# Patient Record
Sex: Male | Born: 1955 | Race: White | Hispanic: No | Marital: Married | Smoking: Never smoker
Health system: Southern US, Community
[De-identification: ages and names within clinical notes are randomized; demographics above are authoritative.]

## PROBLEM LIST (undated history)

## (undated) DIAGNOSIS — I10 Essential (primary) hypertension: Secondary | ICD-10-CM

## (undated) DIAGNOSIS — E7800 Pure hypercholesterolemia, unspecified: Secondary | ICD-10-CM

## (undated) DIAGNOSIS — J189 Pneumonia, unspecified organism: Secondary | ICD-10-CM

---

## 2019-12-22 ENCOUNTER — Encounter (HOSPITAL_COMMUNITY): Payer: Self-pay | Admitting: Emergency Medicine

## 2019-12-22 ENCOUNTER — Emergency Department (HOSPITAL_COMMUNITY): Payer: Medicare HMO

## 2019-12-22 ENCOUNTER — Emergency Department (HOSPITAL_COMMUNITY)
Admission: EM | Admit: 2019-12-22 | Discharge: 2019-12-22 | Disposition: A | Discharge status: Home / Self Care | Payer: Medicare HMO | Attending: Emergency Medicine | Admitting: Emergency Medicine

## 2019-12-22 ENCOUNTER — Other Ambulatory Visit: Payer: Self-pay

## 2019-12-22 DIAGNOSIS — J209 Acute bronchitis, unspecified: Secondary | ICD-10-CM | POA: Insufficient documentation

## 2019-12-22 DIAGNOSIS — I10 Essential (primary) hypertension: Secondary | ICD-10-CM | POA: Insufficient documentation

## 2019-12-22 DIAGNOSIS — Z20822 Contact with and (suspected) exposure to covid-19: Secondary | ICD-10-CM | POA: Insufficient documentation

## 2019-12-22 DIAGNOSIS — R634 Abnormal weight loss: Secondary | ICD-10-CM | POA: Insufficient documentation

## 2019-12-22 DIAGNOSIS — R918 Other nonspecific abnormal finding of lung field: Secondary | ICD-10-CM | POA: Diagnosis not present

## 2019-12-22 DIAGNOSIS — R059 Cough, unspecified: Secondary | ICD-10-CM

## 2019-12-22 DIAGNOSIS — R05 Cough: Secondary | ICD-10-CM | POA: Diagnosis not present

## 2019-12-22 DIAGNOSIS — R911 Solitary pulmonary nodule: Secondary | ICD-10-CM

## 2019-12-22 DIAGNOSIS — R0602 Shortness of breath: Secondary | ICD-10-CM | POA: Diagnosis present

## 2019-12-22 DIAGNOSIS — R630 Anorexia: Secondary | ICD-10-CM | POA: Diagnosis not present

## 2019-12-22 HISTORY — DX: Pneumonia, unspecified organism: J18.9

## 2019-12-22 HISTORY — DX: Pure hypercholesterolemia, unspecified: E78.00

## 2019-12-22 HISTORY — DX: Essential (primary) hypertension: I10

## 2019-12-22 LAB — CBC
HCT: 48.3 % (ref 39.0–52.0)
Hemoglobin: 16.8 g/dL (ref 13.0–17.0)
MCH: 31.1 pg (ref 26.0–34.0)
MCHC: 34.8 g/dL (ref 30.0–36.0)
MCV: 89.4 fL (ref 80.0–100.0)
Platelets: 214 10*3/uL (ref 150–400)
RBC: 5.4 MIL/uL (ref 4.22–5.81)
RDW: 12.4 % (ref 11.5–15.5)
WBC: 7.5 10*3/uL (ref 4.0–10.5)
nRBC: 0 % (ref 0.0–0.2)

## 2019-12-22 LAB — BASIC METABOLIC PANEL
Anion gap: 11 (ref 5–15)
BUN: 13 mg/dL (ref 8–23)
CO2: 27 mmol/L (ref 22–32)
Calcium: 9.7 mg/dL (ref 8.9–10.3)
Chloride: 98 mmol/L (ref 98–111)
Creatinine, Ser: 1.08 mg/dL (ref 0.61–1.24)
GFR calc Af Amer: >60 mL/min (ref >60)
GFR calc non Af Amer: >60 mL/min (ref >60)
Glucose, Bld: 109 mg/dL — ABNORMAL HIGH (ref 70–99)
Potassium: 4.4 mmol/L (ref 3.5–5.1)
Sodium: 136 mmol/L (ref 135–145)

## 2019-12-22 LAB — POCT I-STAT EG7
Acid-Base Excess: 3 mmol/L — ABNORMAL HIGH (ref 0.0–2.0)
Bicarbonate: 28.1 mmol/L — ABNORMAL HIGH (ref 20.0–28.0)
Calcium, Ion: 1.16 mmol/L (ref 1.15–1.40)
HCT: 46 % (ref 39.0–52.0)
Hemoglobin: 15.6 g/dL (ref 13.0–17.0)
O2 Saturation: 46 %
Potassium: 4.6 mmol/L (ref 3.5–5.1)
Sodium: 136 mmol/L (ref 135–145)
TCO2: 29 mmol/L (ref 22–32)
pCO2, Ven: 42.7 mmHg — ABNORMAL LOW (ref 44.0–60.0)
pH, Ven: 7.427 (ref 7.250–7.430)
pO2, Ven: 25 mmHg — CL (ref 32.0–45.0)

## 2019-12-22 LAB — POC SARS CORONAVIRUS 2 AG -  ED: SARS Coronavirus 2 Ag: NEGATIVE

## 2019-12-22 LAB — D-DIMER, QUANTITATIVE: D-Dimer, Quant: 0.31 ug/mL-FEU (ref 0.00–0.50)

## 2019-12-22 LAB — TROPONIN I (HIGH SENSITIVITY)
Troponin I (High Sensitivity): 5 ng/L (ref <18)
Troponin I (High Sensitivity): 6 ng/L (ref <18)

## 2019-12-22 LAB — SARS CORONAVIRUS 2 (TAT 6-24 HRS): SARS Coronavirus 2: NEGATIVE

## 2019-12-22 MED ORDER — HYDROCOD POLST-CPM POLST ER 10-8 MG/5ML PO SUER
5.0000 mL | Freq: Once | ORAL | Status: AC
  Administered 2019-12-22: 5 mL via ORAL
  Filled 2019-12-22: qty 5

## 2019-12-22 MED ORDER — IOHEXOL 350 MG/ML SOLN
100.0000 mL | Freq: Once | INTRAVENOUS | Status: AC | PRN
  Administered 2019-12-22: 58 mL via INTRAVENOUS

## 2019-12-22 MED ORDER — SODIUM CHLORIDE 0.9 % IV BOLUS
1000.0000 mL | Freq: Once | INTRAVENOUS | Status: AC
  Administered 2019-12-22: 1000 mL via INTRAVENOUS

## 2019-12-22 MED ORDER — SODIUM CHLORIDE 0.9% FLUSH
3.0000 mL | Freq: Once | INTRAVENOUS | Status: AC
  Administered 2019-12-22: 3 mL via INTRAVENOUS

## 2019-12-22 MED ORDER — ALBUTEROL SULFATE HFA 108 (90 BASE) MCG/ACT IN AERS
2.0000 | INHALATION_SPRAY | RESPIRATORY_TRACT | Status: DC | PRN
  Administered 2019-12-22: 2 via RESPIRATORY_TRACT
  Filled 2019-12-22: qty 6.7

## 2019-12-22 MED ORDER — BENZONATATE 100 MG PO CAPS: 100.0000 mg | ORAL_CAPSULE | Freq: Three times a day (TID) | ORAL | 0 refills | Status: AC

## 2019-12-22 MED ORDER — DEXAMETHASONE SODIUM PHOSPHATE 10 MG/ML IJ SOLN
10.0000 mg | Freq: Once | INTRAMUSCULAR | Status: AC
  Administered 2019-12-22: 10 mg via INTRAVENOUS
  Filled 2019-12-22: qty 1

## 2019-12-22 NOTE — ED Provider Notes (Signed)
[facility] EMERGENCY DEPARTMENT Provider Note   CSN: [id number] Arrival date & time: 12/22/19  1218     History Chief Complaint  Patient presents with  . Shortness of Breath    Hendryx Ricke is a 64 y.o. male.  HPI 64 year old male history of hypertension, hypercholesterolemia presents today complaining of cough and dyspnea.  Patient has had some URI symptoms for 3 to 4 weeks.  He has been seen in his primary care office February 2 thought to have sinusitis.  He was placed on Zithromax.  He returned on February 8 and had cough and increased dyspnea.  He was placed on Cefpodoxime and doxycycline.  He completed a week of these.  He has continued to feel dyspneic and have a productive cough.  Complaining of chest pain only with coughing.  He was sent for Covid test at one point in time, but told that it was too late for him to be tested and has not had any Covid testing.  He has had no definite Covid exposures.  He is not a smoker and has no history of lung disease.  He reports significant weight loss of 10 to 20 pounds.  He has not had nausea or vomiting but has had decreased appetite.    Past Medical History:  Diagnosis Date  . High cholesterol   . Hypertension   . Pneumonia     There are no problems to display for this patient.   History reviewed. No pertinent surgical history.     No family history on file.  Social History   Tobacco Use  . Smoking status: Never Smoker  . Smokeless tobacco: Never Used  Substance Use Topics  . Alcohol use: Not Currently  . Drug use: Never    Home Medications Prior to Admission medications   Not on File    Allergies    Patient has no allergy information on record.  Review of Systems   Review of Systems  All other systems reviewed and are negative.   Physical Exam Updated Vital Signs BP 137/79 (BP Location: Right Arm)   Pulse 85   Temp 98 F (36.7 C) (Oral)   Resp 16   Ht 1.905 m (6\' 3" )   Wt 85.7 kg    SpO2 93%   BMI 23.62 kg/m   Physical Exam Vitals reviewed.  Constitutional:      Appearance: He is well-developed. He is ill-appearing.  HENT:     Head: Normocephalic and atraumatic.     Mouth/Throat:     Mouth: Mucous membranes are moist.  Eyes:     Pupils: Pupils are equal, round, and reactive to light.  Cardiovascular:     Rate and Rhythm: Normal rate and regular rhythm.  Pulmonary:     Effort: Pulmonary effort is normal.     Breath sounds: Normal breath sounds.  Abdominal:     Palpations: Abdomen is soft.  Musculoskeletal:        General: Normal range of motion.     Cervical back: Normal range of motion and neck supple.     Right lower leg: No tenderness. No edema.     Left lower leg: No tenderness. No edema.  Skin:    General: Skin is warm and dry.     Capillary Refill: Capillary refill takes less than 2 seconds.  Neurological:     General: No focal deficit present.     Mental Status: He is alert.  Psychiatric:  Mood and Affect: Mood normal.     ED Results / Procedures / Treatments   Labs (all labs ordered are listed, but only abnormal results are displayed) Labs Reviewed  BASIC METABOLIC PANEL - Abnormal; Notable for the following components:      Result Value   Glucose, Bld 109 (*)    All other components within normal limits  POCT I-STAT EG7 - Abnormal; Notable for the following components:   pCO2, Ven 42.7 (*)    pO2, Ven 25.0 (*)    Bicarbonate 28.1 (*)    Acid-Base Excess 3.0 (*)    All other components within normal limits  SARS CORONAVIRUS 2 (TAT 6-24 HRS)  CBC  D-DIMER, QUANTITATIVE (NOT AT [facility])  BLOOD GAS, VENOUS  POC SARS CORONAVIRUS 2 AG -  ED  TROPONIN I (HIGH SENSITIVITY)    EKG None  Radiology DG Chest Port 1 View  Result Date: 12/22/2019 CLINICAL DATA:  64 year old male with history of cough and dyspnea for 1 month. EXAM: PORTABLE CHEST 1 VIEW COMPARISON:  No priors. FINDINGS: Lung volumes are normal. No consolidative  airspace disease. No pleural effusions. No pneumothorax. No pulmonary nodule or mass noted. Pulmonary vasculature and the cardiomediastinal silhouette are within normal limits. IMPRESSION: No radiographic evidence of acute cardiopulmonary disease. Electronically Signed   By: Trudie Reed M.D.   On: 12/22/2019 13:27    Procedures Procedures (including critical care time)  Medications Ordered in ED Medications  sodium chloride flush (NS) 0.9 % injection 3 mL (3 mLs Intravenous Given 12/22/19 1319)  sodium chloride 0.9 % bolus 1,000 mL (1,000 mLs Intravenous New Bag/Given 12/22/19 1318)    ED Course  I have reviewed the triage vital signs and the nursing notes.  Pertinent labs & imaging results that were available during my care of the patient were reviewed by me and considered in my medical decision making (see chart for details).    MDM Rules/Calculators/A&P                      64 year old male with 1 month history of URI, infectious symptoms, and cough.  He has also had significant weight loss during this time.  He is having ongoing dyspnea.  Point-of-care Covid test negative.  Has not been tested for Covid outside hospital.  He has been on multiple multiple rounds of antibiotics.  Patient is having CT angiogram of the chest to evaluate for PE or other intrathoracic abnormalities.  Patient care and follow-up discussed with Dr. Jodi Mourning and he has assumed care Final Clinical Impression(s) / ED Diagnoses Final diagnoses:  SOB (shortness of breath)  Cough    Rx / DC Orders ED Discharge Orders    None       Margarita Grizzle, MD 12/22/19 1623

## 2019-12-22 NOTE — ED Triage Notes (Signed)
Pt reports SOB and productive cough with clear phlegm x 1 month.  Diagnosed with pneumonia 3 weeks ago.  Completed 3 rounds of antibiotics and prednisone.  20lb weight loss in last month.  Tightness in center of chest only with coughing.  Has not been tested for COVID.  Seen by PCP this week and states he would set-up chest CT which has not been scheduled yet.

## 2019-12-22 NOTE — ED Provider Notes (Signed)
Patient CARE signed out to reassess and follow-up CT angio of the chest.  Patient had recurrent cough and respiratory symptoms gradually worsening for weeks despite being on antibiotics, prednisone and other supportive care measures.  Patient denies any known lung disease, no known lung cancer however father died of lung cancer.  Patient has had intermittent wheezing.  Patient has albuterol with minimal help at home.  On exam patient is not requiring oxygen, normal work of breathing while seated, coughing in the room.  Discussed differential diagnosis and importance of follow-up for lung function testing, reassessment, Covid test results and discussed reasons to return the emergency room.  Discussed small pulmonary nodule that needs close outpatient follow-up.  Decadron given in the ER patient comfortable with outpatient follow-up. CT no PE.  Kenton Kingfisher, MD 12/22/19 [phone]

## 2019-12-22 NOTE — Discharge Instructions (Addendum)
Have lung function testing with your primary doctor especially if covid neg. Follow up covid result tomorrow, isolate until then. Have repeat CT chest in 6 to 12 months.  Your steroid shot will last 3 days. COugh medicine as well as needed. Return for worsening shortness of breath.

## 2019-12-24 ENCOUNTER — Telehealth (HOSPITAL_COMMUNITY): Payer: Self-pay

## 2021-02-24 IMAGING — CT CT ANGIO CHEST
2 of 7 series · 18 of 46 positions shown · IV contrast (omnipaque)
Comparison: Chest radiographs dated 12/22/2019

CLINICAL DATA: Shortness of breath, cough

EXAM:
CT ANGIOGRAPHY CHEST WITH CONTRAST
TECHNIQUE: Multidetector CT imaging of the chest was performed using the
standard protocol during bolus administration of intravenous
contrast. Multiplanar CT image reconstructions and MIPs were
obtained to evaluate the vascular anatomy.
CONTRAST:  58mL OMNIPAQUE IOHEXOL 350 MG/ML SOLN

[Series 7: thins · axial · 0.74mm/px · z∈[-329,-6]mm · 15 of 519 slices shown]
[im 29/519  lung]
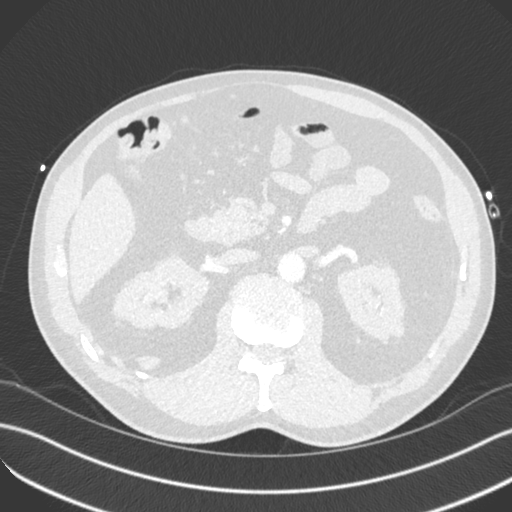
[im 58/519  soft-tissue]
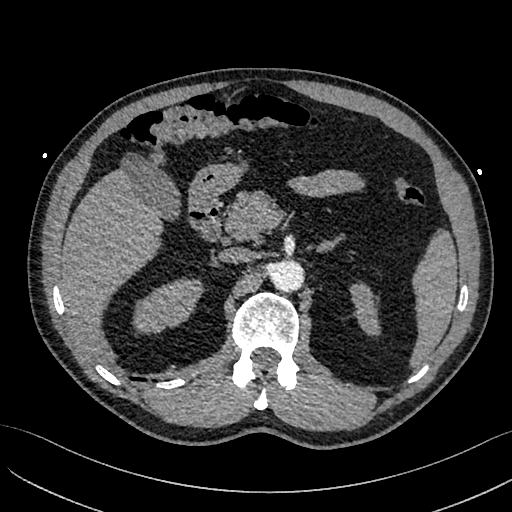
[im 87/519  lung]
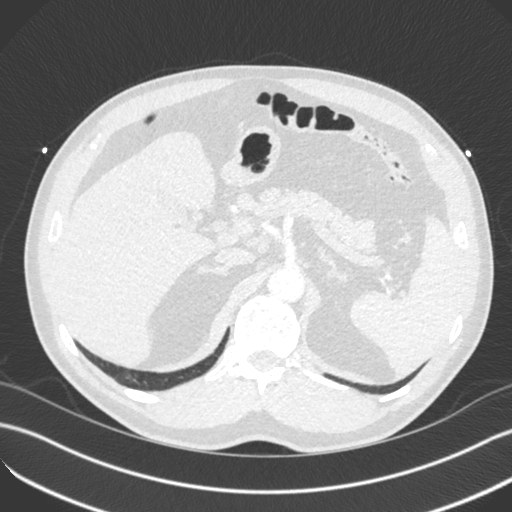
[im 116/519  soft-tissue]
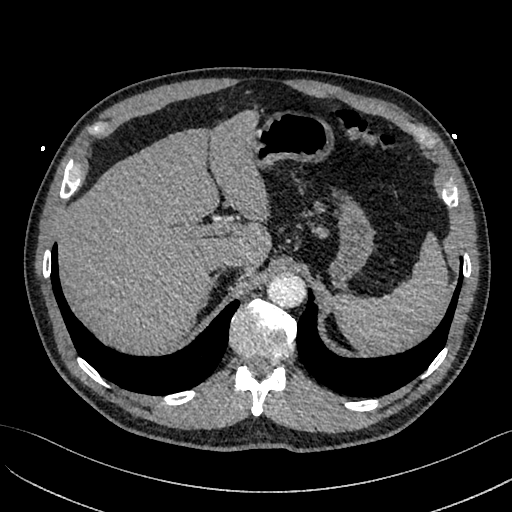
[im 173/519  lung]
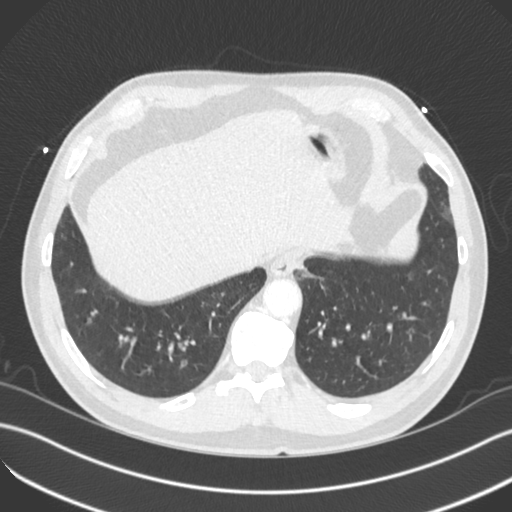
[im 202/519  soft-tissue]
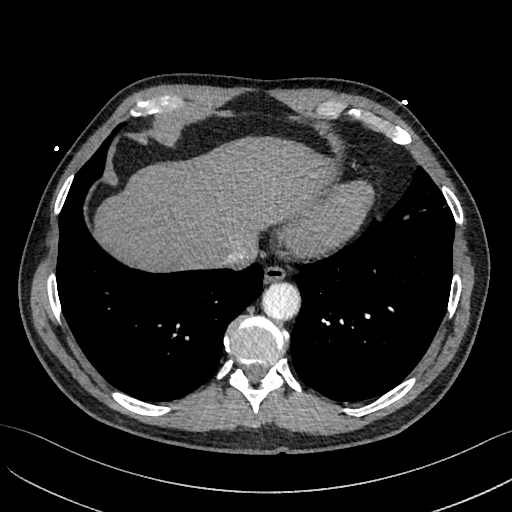
[im 231/519  lung]
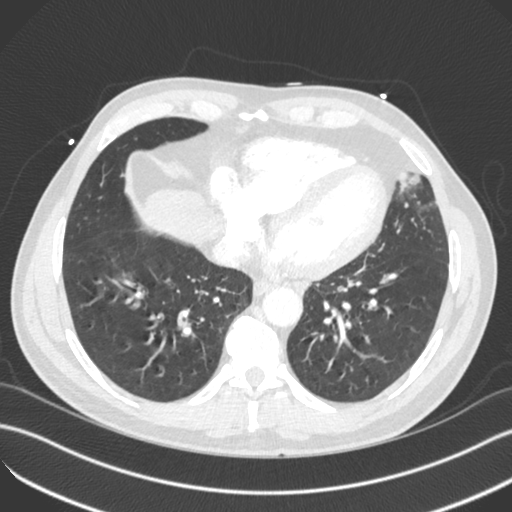
[im 260/519  soft-tissue]
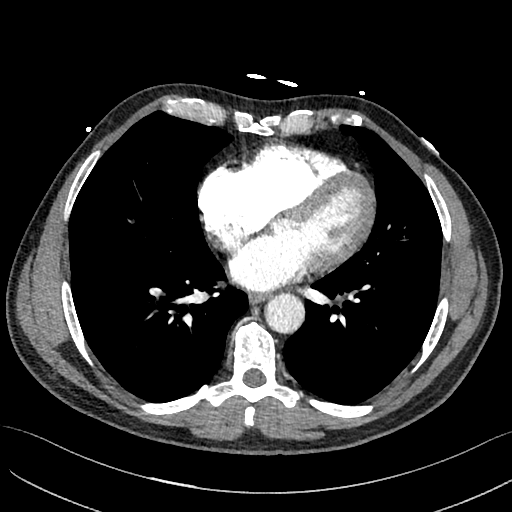
[im 288/519  lung]
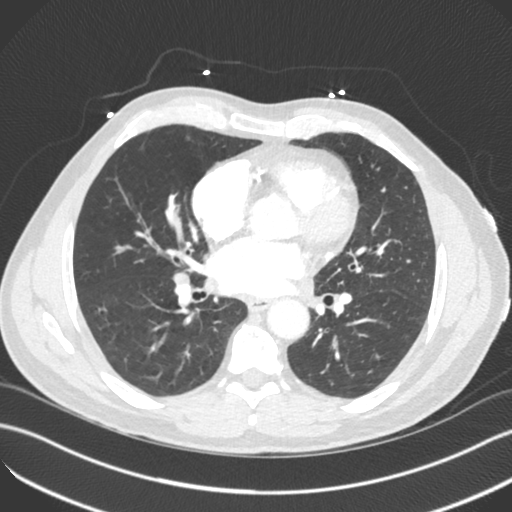
[im 317/519  soft-tissue]
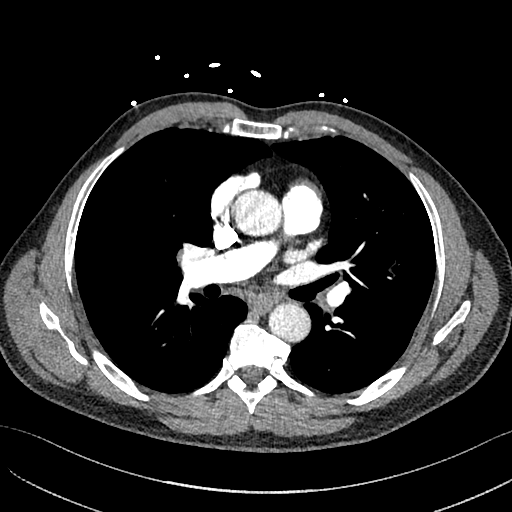
[im 346/519  lung]
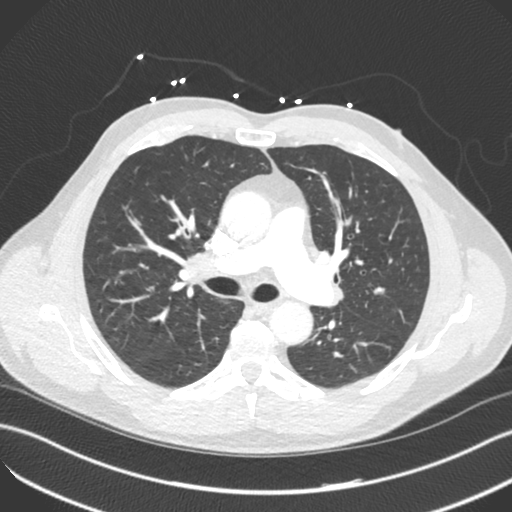
[im 403/519  soft-tissue]
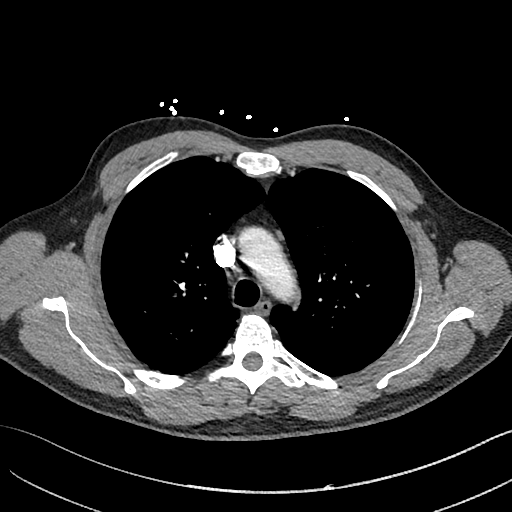
[im 432/519  lung]
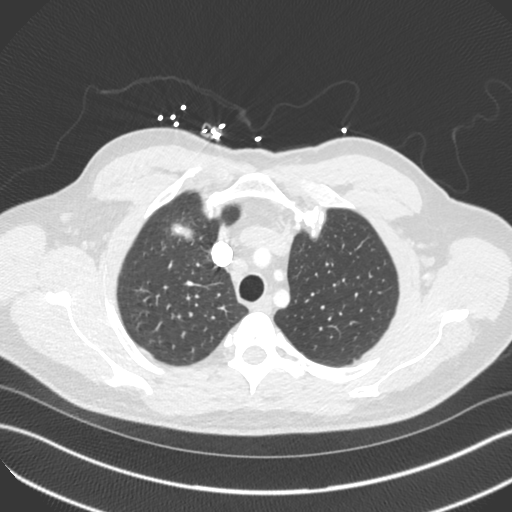
[im 461/519  soft-tissue]
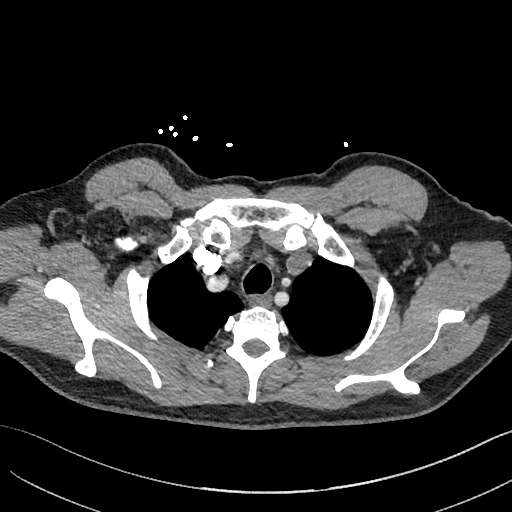
[im 490/519  lung]
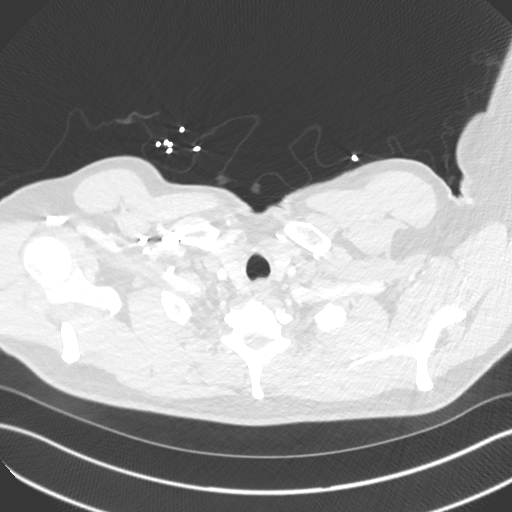

[Series 8: cor · coronal · 0.72mm/px · 3 of 147 slices shown]
[im 37/147  soft-tissue]
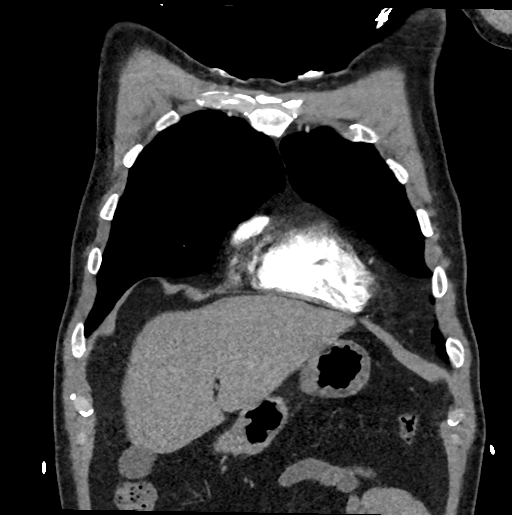
[im 74/147  soft-tissue]
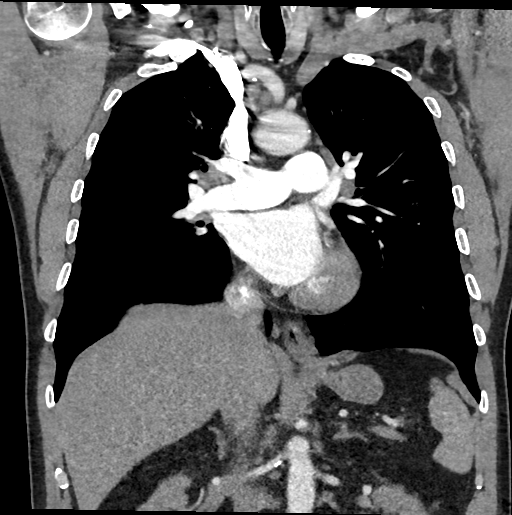
[im 110/147  soft-tissue]
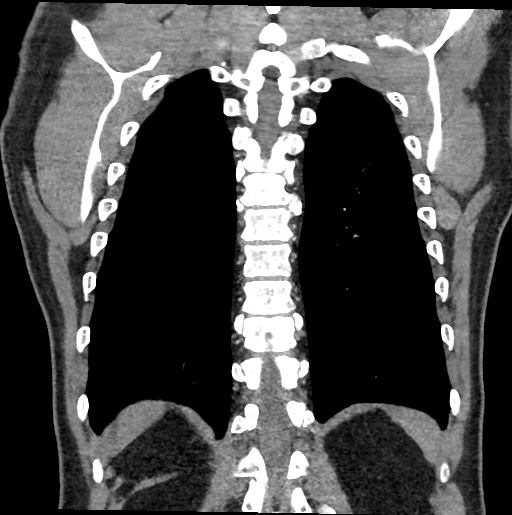

[18 of 46 positions shown; findings below may reference images not displayed]

FINDINGS: Cardiovascular: Satisfactory opacification the bilateral pulmonary
arteries to the segmental level. No evidence of pulmonary embolism.

No evidence of thoracic aortic aneurysm or dissection.
Atherosclerotic calcifications of the aortic arch.

Heart is normal in size. No pericardial effusion.

Mild three-vessel coronary atherosclerosis.

Mediastinum/Nodes: Small mediastinal lymph nodes which do not meet
pathologic CT size criteria.

Visualized thyroid is unremarkable.

Lungs/Pleura: Mild biapical pleural-parenchymal scarring.

Evaluation of the lung parenchyma is constrained by respiratory
motion. Within that constraint, there are no suspicious pulmonary
nodules. 6 x 3 mm triangular perifissural nodule in the anterior
right middle lobe along the minor fissure (series 6/image 80),
benign.

Mild patchy opacities in the lingula (series 6/image 109).
Additional mild subpleural ground-glass opacity in the left upper
lobe (series 6/image 55) and in the anterior right upper lobe
(series 6/image 46). Additional mild subpleural ground-glass opacity
in the medial left lower lobe (series 6/image 122). While at least
some of this appearance may reflect subsegmental atelectasis, mild
infection is difficult to exclude. Given the subpleural/peripheral
distribution, atypical/viral infection including COVID is possible.

No pleural effusion or pneumothorax.

Upper Abdomen: Visualized upper abdomen is grossly unremarkable.

Musculoskeletal: Mild degenerative changes of the mid thoracic
spine.

Review of the MIP images confirms the above findings.
IMPRESSION: No evidence of pulmonary embolism.

Very mild scattered ground-glass opacities, subpleural/peripheral,
raising the possibility of mild infection. Atypical/viral infection
including COVID is possible.

6 x 3 mm perifissural nodule in the anterior right middle lobe,
benign. Given the characteristic appearance of a benign subpleural
lymph node and the perifissural location, follow-up imaging is not
required per [HOSPITAL] guidelines. This recommendation
follows the consensus statement: Guidelines for Management of Small
Pulmonary Nodules Detected on CT Images: From the [HOSPITAL]

Aortic Atherosclerosis (FV5UJ-YG8.8).

## 2021-02-24 IMAGING — DX DG CHEST 1V PORT
1 series · 1 of 1 positions shown · non-contrast
Comparison: No priors.

CLINICAL DATA: 64-year-old male with history of cough and dyspnea
for 1 month.

EXAM:
PORTABLE CHEST 1 VIEW

[chest]
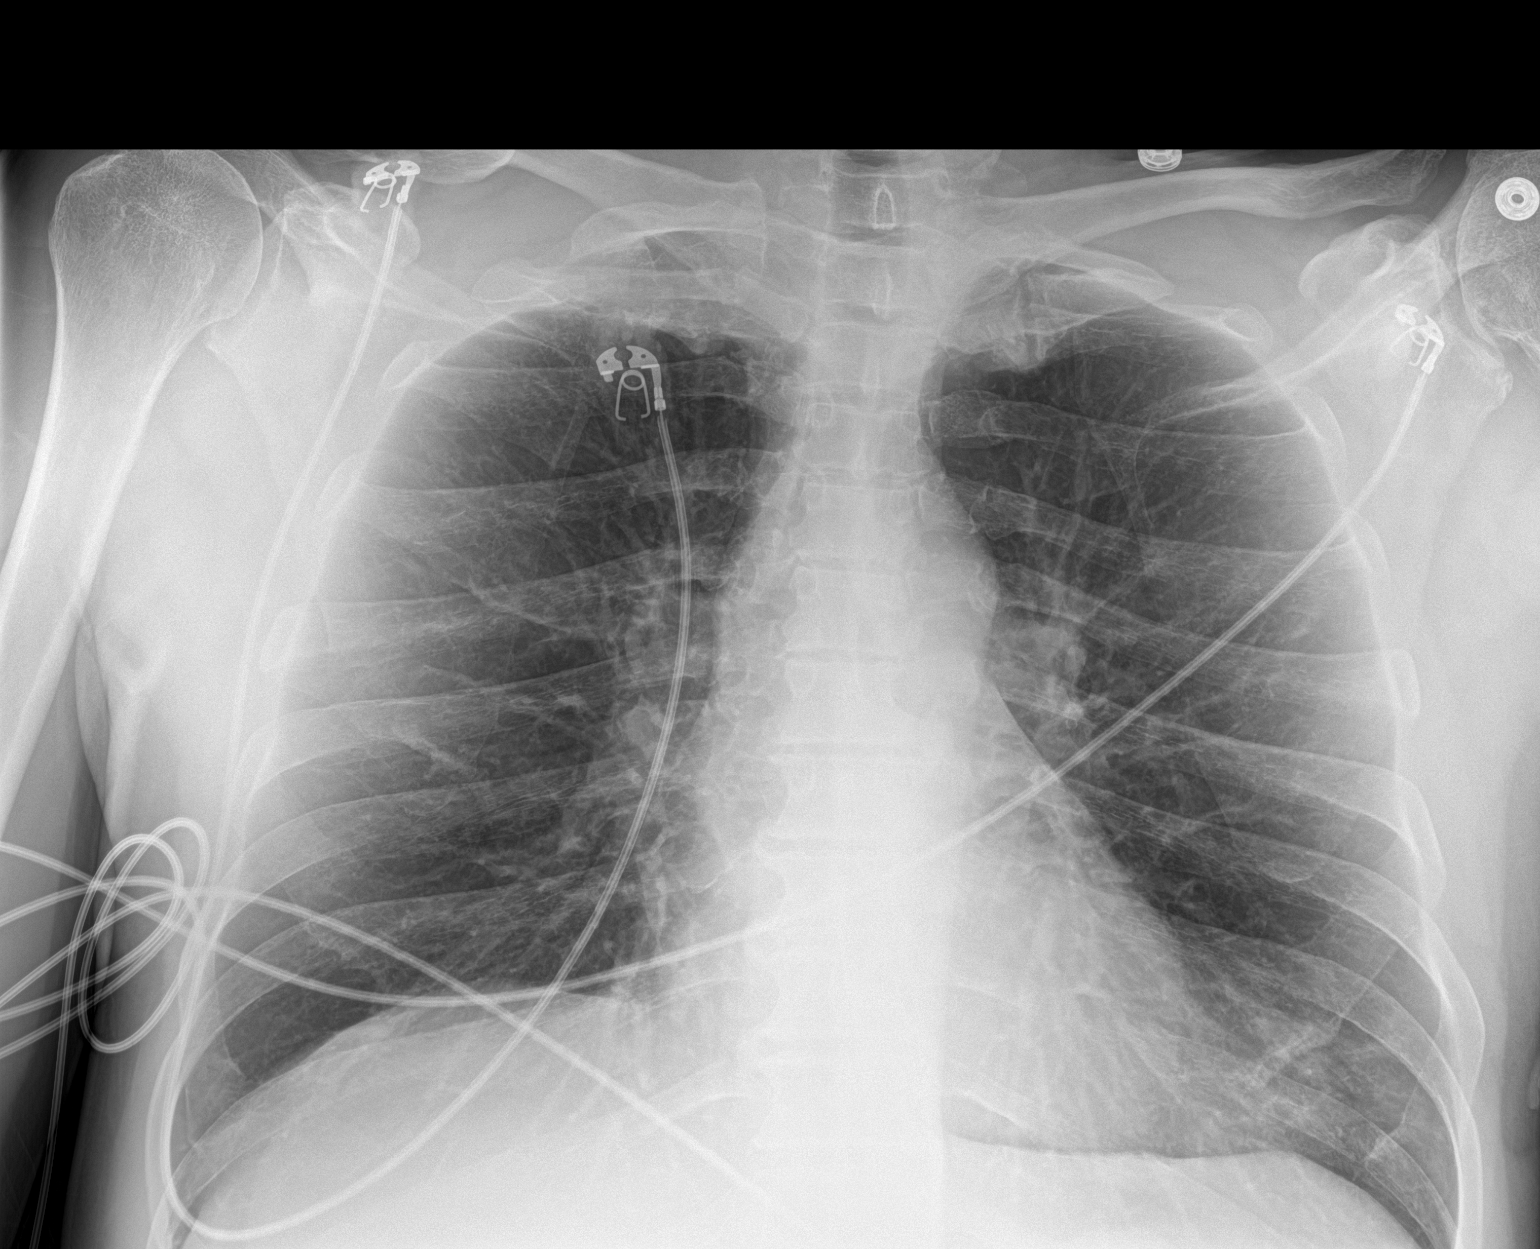

[1 of 1 positions shown; findings below may reference images not displayed]

FINDINGS: Lung volumes are normal. No consolidative airspace disease. No
pleural effusions. No pneumothorax. No pulmonary nodule or mass
noted. Pulmonary vasculature and the cardiomediastinal silhouette
are within normal limits.
IMPRESSION: No radiographic evidence of acute cardiopulmonary disease.
